# Patient Record
Sex: Male | Born: 1975 | Race: White | Hispanic: No | Marital: Married | Smoking: Current every day smoker
Health system: Southern US, Community
[De-identification: ages and names within clinical notes are randomized; demographics above are authoritative.]

---

## 2003-08-27 ENCOUNTER — Emergency Department (HOSPITAL_COMMUNITY): Admission: EM | Admit: 2003-08-27 | Discharge: 2003-08-27 | Payer: Self-pay | Admitting: Emergency Medicine

## 2004-10-10 ENCOUNTER — Emergency Department (HOSPITAL_COMMUNITY): Admission: EM | Admit: 2004-10-10 | Discharge: 2004-10-11 | Payer: Self-pay | Admitting: Emergency Medicine

## 2005-12-02 ENCOUNTER — Emergency Department (HOSPITAL_COMMUNITY): Admission: EM | Admit: 2005-12-02 | Discharge: 2005-12-02 | Payer: Self-pay | Admitting: Emergency Medicine

## 2006-01-17 ENCOUNTER — Emergency Department (HOSPITAL_COMMUNITY): Admission: EM | Admit: 2006-01-17 | Discharge: 2006-01-17 | Payer: Self-pay | Admitting: Emergency Medicine

## 2010-12-14 ENCOUNTER — Encounter (HOSPITAL_COMMUNITY): Payer: Self-pay

## 2010-12-14 MED ORDER — HEPARIN SOD (PORK) LOCK FLUSH 100 UNIT/ML IV SOLN
500.0000 [IU] | Freq: Once | INTRAVENOUS | Status: AC
  Filled 2010-12-14: qty 5

## 2010-12-14 MED ORDER — SODIUM CHLORIDE 0.9 % IJ SOLN
10.0000 mL | Freq: Once | INTRAMUSCULAR | Status: AC
  Filled 2010-12-14: qty 10

## 2010-12-14 MED ORDER — HEPARIN SOD (PORK) LOCK FLUSH 100 UNIT/ML IV SOLN
INTRAVENOUS | Status: AC
  Filled 2010-12-14: qty 5

## 2010-12-14 MED ORDER — SODIUM CHLORIDE 0.9 % IJ SOLN
INTRAMUSCULAR | Status: AC
  Filled 2010-12-14: qty 20

## 2011-01-07 ENCOUNTER — Ambulatory Visit (HOSPITAL_COMMUNITY): Payer: Self-pay | Admitting: Oncology

## 2011-01-14 MED ORDER — EPOETIN ALFA 40000 UNIT/ML IJ SOLN
INTRAMUSCULAR | Status: AC
  Filled 2011-01-14: qty 1

## 2011-04-24 ENCOUNTER — Other Ambulatory Visit (HOSPITAL_COMMUNITY): Payer: Self-pay | Admitting: Oncology

## 2011-10-14 ENCOUNTER — Encounter (HOSPITAL_COMMUNITY): Payer: Self-pay | Admitting: Oncology

## 2015-12-05 ENCOUNTER — Encounter (HOSPITAL_COMMUNITY)
Admission: EM | Disposition: A | Discharge status: Home / Self Care | Payer: Self-pay | Source: Home / Self Care | Attending: Emergency Medicine

## 2015-12-05 ENCOUNTER — Emergency Department (HOSPITAL_COMMUNITY): Payer: Self-pay

## 2015-12-05 ENCOUNTER — Emergency Department (HOSPITAL_COMMUNITY): Payer: Self-pay | Admitting: Anesthesiology

## 2015-12-05 ENCOUNTER — Ambulatory Visit (HOSPITAL_COMMUNITY)
Admission: EM | Admit: 2015-12-05 | Discharge: 2015-12-05 | Disposition: A | Discharge status: Home / Self Care | Payer: Self-pay | Attending: Emergency Medicine | Admitting: Emergency Medicine

## 2015-12-05 ENCOUNTER — Encounter (HOSPITAL_COMMUNITY): Payer: Self-pay | Admitting: Emergency Medicine

## 2015-12-05 DIAGNOSIS — S60552A Superficial foreign body of left hand, initial encounter: Secondary | ICD-10-CM

## 2015-12-05 DIAGNOSIS — Z1810 Retained metal fragments, unspecified: Secondary | ICD-10-CM | POA: Insufficient documentation

## 2015-12-05 DIAGNOSIS — F17210 Nicotine dependence, cigarettes, uncomplicated: Secondary | ICD-10-CM | POA: Insufficient documentation

## 2015-12-05 DIAGNOSIS — Y93H3 Activity, building and construction: Secondary | ICD-10-CM | POA: Insufficient documentation

## 2015-12-05 DIAGNOSIS — Y998 Other external cause status: Secondary | ICD-10-CM | POA: Insufficient documentation

## 2015-12-05 DIAGNOSIS — W294XXA Contact with nail gun, initial encounter: Secondary | ICD-10-CM | POA: Insufficient documentation

## 2015-12-05 DIAGNOSIS — S62621B Displaced fracture of medial phalanx of left index finger, initial encounter for open fracture: Secondary | ICD-10-CM | POA: Insufficient documentation

## 2015-12-05 DIAGNOSIS — S60458A Superficial foreign body of other finger, initial encounter: Secondary | ICD-10-CM

## 2015-12-05 HISTORY — PX: FOREIGN BODY REMOVAL: SHX962

## 2015-12-05 HISTORY — PX: I & D EXTREMITY: SHX5045

## 2015-12-05 LAB — CBC WITH DIFFERENTIAL/PLATELET
Basophils Absolute: 0.1 10*3/uL (ref 0.0–0.1)
Basophils Relative: 1 %
Eosinophils Absolute: 0.4 10*3/uL (ref 0.0–0.7)
Eosinophils Relative: 4 %
HEMATOCRIT: 48.4 % (ref 39.0–52.0)
HEMOGLOBIN: 16.1 g/dL (ref 13.0–17.0)
LYMPHS ABS: 1.6 10*3/uL (ref 0.7–4.0)
LYMPHS PCT: 16 %
MCH: 30.8 pg (ref 26.0–34.0)
MCHC: 33.3 g/dL (ref 30.0–36.0)
MCV: 92.5 fL (ref 78.0–100.0)
MONOS PCT: 12 %
Monocytes Absolute: 1.2 10*3/uL — ABNORMAL HIGH (ref 0.1–1.0)
NEUTROS ABS: 6.8 10*3/uL (ref 1.7–7.7)
NEUTROS PCT: 67 %
Platelets: 224 10*3/uL (ref 150–400)
RBC: 5.23 MIL/uL (ref 4.22–5.81)
RDW: 12.9 % (ref 11.5–15.5)
WBC: 10 10*3/uL (ref 4.0–10.5)

## 2015-12-05 LAB — COMPREHENSIVE METABOLIC PANEL
ALK PHOS: 79 U/L (ref 38–126)
ALT: 23 U/L (ref 17–63)
AST: 23 U/L (ref 15–41)
Albumin: 4.3 g/dL (ref 3.5–5.0)
Anion gap: 6 (ref 5–15)
BUN: 10 mg/dL (ref 6–20)
CALCIUM: 9 mg/dL (ref 8.9–10.3)
CO2: 27 mmol/L (ref 22–32)
CREATININE: 1.26 mg/dL — AB (ref 0.61–1.24)
Chloride: 105 mmol/L (ref 101–111)
Glucose, Bld: 105 mg/dL — ABNORMAL HIGH (ref 65–99)
Potassium: 3.3 mmol/L — ABNORMAL LOW (ref 3.5–5.1)
Sodium: 138 mmol/L (ref 135–145)
Total Bilirubin: 0.7 mg/dL (ref 0.3–1.2)
Total Protein: 6.3 g/dL — ABNORMAL LOW (ref 6.5–8.1)

## 2015-12-05 SURGERY — IRRIGATION AND DEBRIDEMENT EXTREMITY
Anesthesia: General | Site: Finger | Laterality: Left

## 2015-12-05 MED ORDER — VANCOMYCIN HCL IN DEXTROSE 1-5 GM/200ML-% IV SOLN
1000.0000 mg | Freq: Once | INTRAVENOUS | Status: AC
  Administered 2015-12-05: 1000 mg via INTRAVENOUS
  Filled 2015-12-05: qty 200

## 2015-12-05 MED ORDER — MORPHINE SULFATE (PF) 2 MG/ML IV SOLN: 2.0000 mg | INTRAVENOUS | Status: DC | PRN

## 2015-12-05 MED ORDER — MIDAZOLAM HCL 2 MG/2ML IJ SOLN
INTRAMUSCULAR | Status: AC
  Filled 2015-12-05: qty 2

## 2015-12-05 MED ORDER — FENTANYL CITRATE (PF) 100 MCG/2ML IJ SOLN
INTRAMUSCULAR | Status: AC
  Filled 2015-12-05: qty 2

## 2015-12-05 MED ORDER — LIDOCAINE HCL (CARDIAC) 20 MG/ML IV SOLN
INTRAVENOUS | Status: DC | PRN
  Administered 2015-12-05: 60 mg via INTRAVENOUS

## 2015-12-05 MED ORDER — PROPOFOL 10 MG/ML IV BOLUS
INTRAVENOUS | Status: AC
  Filled 2015-12-05: qty 20

## 2015-12-05 MED ORDER — BUPIVACAINE HCL (PF) 0.25 % IJ SOLN
INTRAMUSCULAR | Status: DC | PRN
  Administered 2015-12-05: 7 mL

## 2015-12-05 MED ORDER — SODIUM CHLORIDE 0.9 % IR SOLN
Status: DC | PRN
  Administered 2015-12-05: 3000 mL

## 2015-12-05 MED ORDER — FENTANYL CITRATE (PF) 100 MCG/2ML IJ SOLN
INTRAMUSCULAR | Status: DC | PRN
  Administered 2015-12-05 (×4): 50 ug via INTRAVENOUS

## 2015-12-05 MED ORDER — ONDANSETRON HCL 4 MG/2ML IJ SOLN
INTRAMUSCULAR | Status: DC | PRN
  Administered 2015-12-05: 4 mg via INTRAVENOUS

## 2015-12-05 MED ORDER — EPHEDRINE 5 MG/ML INJ
INTRAVENOUS | Status: AC
  Filled 2015-12-05: qty 10

## 2015-12-05 MED ORDER — MIDAZOLAM HCL 5 MG/5ML IJ SOLN
INTRAMUSCULAR | Status: DC | PRN
  Administered 2015-12-05: 2 mg via INTRAVENOUS

## 2015-12-05 MED ORDER — OXYCODONE-ACETAMINOPHEN 5-325 MG PO TABS
2.0000 | ORAL_TABLET | ORAL | 0 refills | Status: AC | PRN
Start: 2015-12-05 — End: ?

## 2015-12-05 MED ORDER — LACTATED RINGERS IV SOLN
INTRAVENOUS | Status: DC
  Administered 2015-12-05 (×3): via INTRAVENOUS

## 2015-12-05 MED ORDER — CIPROFLOXACIN HCL 500 MG PO TABS: 500.0000 mg | ORAL_TABLET | Freq: Two times a day (BID) | ORAL | 0 refills | Status: AC

## 2015-12-05 MED ORDER — PROPOFOL 10 MG/ML IV BOLUS
INTRAVENOUS | Status: DC | PRN
  Administered 2015-12-05: 200 mg via INTRAVENOUS

## 2015-12-05 MED ORDER — HYDROMORPHONE HCL 1 MG/ML IJ SOLN: 0.2500 mg | INTRAMUSCULAR | Status: DC | PRN

## 2015-12-05 MED ORDER — METHOCARBAMOL 1000 MG/10ML IJ SOLN
500.0000 mg | Freq: Once | INTRAMUSCULAR | Status: DC
  Filled 2015-12-05: qty 5

## 2015-12-05 MED ORDER — BUPIVACAINE HCL (PF) 0.25 % IJ SOLN
INTRAMUSCULAR | Status: AC
  Filled 2015-12-05: qty 30

## 2015-12-05 MED ORDER — PROMETHAZINE HCL 25 MG/ML IJ SOLN: 6.2500 mg | INTRAMUSCULAR | Status: DC | PRN

## 2015-12-05 MED ORDER — FENTANYL CITRATE (PF) 100 MCG/2ML IJ SOLN
INTRAMUSCULAR | Status: AC
  Filled 2015-12-05: qty 4

## 2015-12-05 MED ORDER — GLYCOPYRROLATE 0.2 MG/ML IJ SOLN
INTRAMUSCULAR | Status: DC | PRN
  Administered 2015-12-05: 0.2 mg via INTRAVENOUS

## 2015-12-05 MED ORDER — FENTANYL CITRATE (PF) 100 MCG/2ML IJ SOLN
INTRAMUSCULAR | Status: AC
  Administered 2015-12-05: 100 ug via INTRAVENOUS
  Filled 2015-12-05: qty 2

## 2015-12-05 MED ORDER — PHENYLEPHRINE 40 MCG/ML (10ML) SYRINGE FOR IV PUSH (FOR BLOOD PRESSURE SUPPORT)
PREFILLED_SYRINGE | INTRAVENOUS | Status: AC
  Filled 2015-12-05: qty 10

## 2015-12-05 MED ORDER — FENTANYL CITRATE (PF) 100 MCG/2ML IJ SOLN
100.0000 ug | Freq: Once | INTRAMUSCULAR | Status: AC
  Administered 2015-12-05: 100 ug via INTRAVENOUS
  Filled 2015-12-05: qty 2

## 2015-12-05 MED ORDER — LIDOCAINE 2% (20 MG/ML) 5 ML SYRINGE
INTRAMUSCULAR | Status: AC
  Filled 2015-12-05: qty 10

## 2015-12-05 MED ORDER — LORAZEPAM 2 MG/ML IJ SOLN: 0.5000 mg | INTRAMUSCULAR | Status: DC | PRN

## 2015-12-05 MED ORDER — MEPERIDINE HCL 25 MG/ML IJ SOLN: 6.2500 mg | INTRAMUSCULAR | Status: DC | PRN

## 2015-12-05 MED ORDER — DEXAMETHASONE SODIUM PHOSPHATE 10 MG/ML IJ SOLN
INTRAMUSCULAR | Status: AC
  Filled 2015-12-05: qty 1

## 2015-12-05 SURGICAL SUPPLY — 37 items
BANDAGE COBAN STERILE 2 (GAUZE/BANDAGES/DRESSINGS) ×2 IMPLANT
BNDG CONFORM 2 STRL LF (GAUZE/BANDAGES/DRESSINGS) ×4 IMPLANT
CORDS BIPOLAR (ELECTRODE) ×3 IMPLANT
CUFF TOURNIQUET SINGLE 18IN (TOURNIQUET CUFF) ×3 IMPLANT
CUFF TOURNIQUET SINGLE 24IN (TOURNIQUET CUFF) IMPLANT
DRAPE OEC MINIVIEW 54X84 (DRAPES) ×2 IMPLANT
DRSG ADAPTIC 3X8 NADH LF (GAUZE/BANDAGES/DRESSINGS) ×3 IMPLANT
DRSG EMULSION OIL 3X3 NADH (GAUZE/BANDAGES/DRESSINGS) ×2 IMPLANT
GAUZE SPONGE 4X4 12PLY STRL (GAUZE/BANDAGES/DRESSINGS) ×2 IMPLANT
GAUZE XEROFORM 1X8 LF (GAUZE/BANDAGES/DRESSINGS) ×3 IMPLANT
GLOVE BIOGEL M 8.0 STRL (GLOVE) ×3 IMPLANT
GLOVE SS BIOGEL STRL SZ 8 (GLOVE) ×1 IMPLANT
GLOVE SUPERSENSE BIOGEL SZ 8 (GLOVE) ×2
GOWN STRL REUS W/ TWL LRG LVL3 (GOWN DISPOSABLE) ×1 IMPLANT
GOWN STRL REUS W/ TWL XL LVL3 (GOWN DISPOSABLE) ×2 IMPLANT
GOWN STRL REUS W/TWL LRG LVL3 (GOWN DISPOSABLE) ×3
GOWN STRL REUS W/TWL XL LVL3 (GOWN DISPOSABLE) ×6
KIT BASIN OR (CUSTOM PROCEDURE TRAY) ×3 IMPLANT
KIT ROOM TURNOVER OR (KITS) ×3 IMPLANT
MANIFOLD NEPTUNE II (INSTRUMENTS) ×5 IMPLANT
NDL HYPO 25GX1X1/2 BEV (NEEDLE) IMPLANT
NEEDLE HYPO 25GX1X1/2 BEV (NEEDLE) ×3 IMPLANT
NS IRRIG 1000ML POUR BTL (IV SOLUTION) ×3 IMPLANT
PACK ORTHO EXTREMITY (CUSTOM PROCEDURE TRAY) ×3 IMPLANT
PAD ARMBOARD 7.5X6 YLW CONV (MISCELLANEOUS) ×3 IMPLANT
PAD CAST 4YDX4 CTTN HI CHSV (CAST SUPPLIES) ×1 IMPLANT
PADDING CAST COTTON 4X4 STRL (CAST SUPPLIES) ×3
SPLINT FINGER W/BULB (SOFTGOODS) ×2 IMPLANT
SPONGE LAP 4X18 X RAY DECT (DISPOSABLE) ×3 IMPLANT
SUT CHROMIC 5 0 P 3 (SUTURE) ×4 IMPLANT
SUT PROLENE 4 0 P 3 18 (SUTURE) ×2 IMPLANT
SYR CONTROL 10ML LL (SYRINGE) ×2 IMPLANT
TOWEL OR 17X24 6PK STRL BLUE (TOWEL DISPOSABLE) ×3 IMPLANT
TOWEL OR 17X26 10 PK STRL BLUE (TOWEL DISPOSABLE) ×3 IMPLANT
TUBE CONNECTING 12'X1/4 (SUCTIONS) ×1
TUBE CONNECTING 12X1/4 (SUCTIONS) ×2 IMPLANT
YANKAUER SUCT BULB TIP NO VENT (SUCTIONS) ×3 IMPLANT

## 2015-12-05 NOTE — Anesthesia Postprocedure Evaluation (Signed)
Anesthesia Post Note  Patient: Russell MallingDavid R Brobeck  Procedure(s) Performed: Procedure(s) (LRB): IRRIGATION AND DEBRIDEMENT EXTREMITY (Left) REMOVAL FOREIGN BODY EXTREMITY (Left)  Patient location during evaluation: PACU Anesthesia Type: General Level of consciousness: awake, awake and alert and oriented Pain management: pain level controlled Vital Signs Assessment: post-procedure vital signs reviewed and stable Respiratory status: spontaneous breathing, nonlabored ventilation and respiratory function stable Cardiovascular status: blood pressure returned to baseline Anesthetic complications: no    Last Vitals:  Vitals:   12/05/15 1919 12/05/15 1933  BP: 138/81 (!) 141/88  Pulse: 60 65  Resp: 12 12  Temp: 36.6 C     Last Pain:  Vitals:   12/05/15 1919  TempSrc:   PainSc: Asleep                 Elroy Schembri,Jamie COKER

## 2015-12-05 NOTE — ED Notes (Signed)
orto into see pt and permit signed

## 2015-12-05 NOTE — ED Notes (Signed)
Pt states he was using nail gun to put up fence and  His hand struck the trigger and the nail went thru his finger, pt states UTD on tetanus states was 2 years ago

## 2015-12-05 NOTE — Anesthesia Preprocedure Evaluation (Signed)
Anesthesia Evaluation  Patient identified by MRN, date of birth, ID band Patient awake    Reviewed: Allergy & Precautions, NPO status , Patient's Chart, lab work & pertinent test results  Airway Mallampati: II  TM Distance: >3 FB Neck ROM: Full    Dental no notable dental hx.    Pulmonary neg pulmonary ROS, Current Smoker,    Pulmonary exam normal breath sounds clear to auscultation       Cardiovascular negative cardio ROS Normal cardiovascular exam Rhythm:Regular Rate:Normal     Neuro/Psych negative neurological ROS  negative psych ROS   GI/Hepatic negative GI ROS, Neg liver ROS,   Endo/Other  negative endocrine ROS  Renal/GU negative Renal ROS     Musculoskeletal negative musculoskeletal ROS (+)   Abdominal   Peds  Hematology negative hematology ROS (+)   Anesthesia Other Findings   Reproductive/Obstetrics negative OB ROS                             Anesthesia Physical Anesthesia Plan  ASA: II  Anesthesia Plan: General   Post-op Pain Management:    Induction: Intravenous  Airway Management Planned: Oral ETT and LMA  Additional Equipment:   Intra-op Plan:   Post-operative Plan: Extubation in OR  Informed Consent: I have reviewed the patients History and Physical, chart, labs and discussed the procedure including the risks, benefits and alternatives for the proposed anesthesia with the patient or authorized representative who has indicated his/her understanding and acceptance.   Dental advisory given  Plan Discussed with: CRNA  Anesthesia Plan Comments:         Anesthesia Quick Evaluation

## 2015-12-05 NOTE — ED Notes (Signed)
Pt returned from xray

## 2015-12-05 NOTE — Op Note (Signed)
See dictation#[id number]  Status post foreign body removal left index finger (nail from a nail gun)  Flexor tenolysis/tenosynovectomy of the FDP and FDS tendons left index finger Extensor tendon tenolysis/tenosynovectomy left index finger Arthrotomy synovectomy PIP joint with treatment of open fracture about the middle phalanx Xray 4 view hand  Begin early range of motion. Plan for discharge with by mouth antibiotics  Discharge medicines Cipro 500 mg 1 by mouth twice a day OxyIR 5 mg 1-2 every 4-6 hours when necessary pain by mouth  Sorah Falkenstein M.D.

## 2015-12-05 NOTE — ED Triage Notes (Signed)
Pt with nail in 2nd digit in left hand

## 2015-12-05 NOTE — H&P (Signed)
Russell MallingDavid R Dennis is an 40 y.o. male.   Chief Complaint: Noted an injury left index finger HPI: The patient is a pleasant 40 year old gentleman who [facility] for evaluation of his left hand in particular his left index finger. He was working on a fence earlier today when he states while holding a board he accidentally hit the nail gun, nail gun fired impaling his left index finger at the level of the PIP joint. He was seen and evaluated by the emergency room staff his finger is neurovascularly intact however radiographs reveal that the PIP joint has been violated with the nail. Patient currently is comfortable and has no complaints other than he states "he wants to smoke". He denies any other injury to the hand or upper extremity acutely.  History reviewed. No pertinent past medical history.  History reviewed. No pertinent surgical history.  History reviewed. No pertinent family history. Social History:  reports that he has been smoking.  He has never used smokeless tobacco. He reports that he drinks alcohol. He reports that he does not use drugs. The patient smokes approximately 1-2 packs a day and states he consumes alcohol on a daily basis upwards of 12 beers.  Allergies:  Allergies  Allergen Reactions  . Penicillins      (Not in a hospital admission)  No results found for this or any previous visit (from the past 48 hour(s)). Dg Hand Complete Left  Result Date: 12/05/2015 CLINICAL DATA:  Nail gun discharge with nail and index finger. EXAM: LEFT HAND - COMPLETE 3+ VIEW COMPARISON:  None. FINDINGS: Nail passes through the index finger in the region of the proximal aspect of the middle phalanx in the PIP joint. Nail enters from the palm are lateral side. Nail height look is just to the level of the scan. Nail tip protrudes from the dorsal medial portion of the finger. One can appreciate some degree of fracture of the proximal aspect of the middle phalanx.  IMPRESSION: Nail passes through the base of the proximal phalanx and the PIP joint. Electronically Signed   By: Paulina FusiMark  Shogry M.D.   On: 12/05/2015 12:48    Review of Systems  Constitutional: Negative.   Eyes: Negative.   Respiratory: Negative.   Cardiovascular: Negative.   Gastrointestinal: Negative.   Musculoskeletal:       See history of present illness  Skin: Negative.     Blood pressure 132/77, pulse 70, temperature 98 F (36.7 C), temperature source Oral, resp. rate 18, height 5\' 11"  (1.803 m), weight 91.2 kg (201 lb), SpO2 98 %. Physical Exam  The patient is alert and oriented in no acute distress. The patient complains of pain in the affected upper extremity.  The patient is noted to have a normal HEENT exam. Lung fields show equal chest expansion and no shortness of breath. Abdomen exam is nontender without distention. Lower extremity examination does not show any fracture dislocation or blood clot symptoms. Pelvis is stable and the neck and back are stable and nontender. Examination of the left hand shows he has an obvious foreign body about the left index finger, he has a barbed nail entering in the volar PIP region of the finger and exiting dorsally. His refill is intact, his sensation is intact, range of motion is impeded given the foreign body. He has multiple abrasions about the fingers and hand which are superficial and non-complicated Assessment/Plan Left index finger nail gun injury with violation of the PIP  joint and retained foreign body Middle and proximal phalanx fractures left index finger History of chronic alcohol use History of tobacco abuse We have discussed with the patient had recommendations to proceed to the operative suite for formal irrigation and debridement as well as removal of the foreign body given the involvement of the PIP joint family irrigation in the operative suite is recommended. We have discussed with the patient withdrawal precautions given  his daily consumption of alcohol, upwards of 12 beers daily. He denies having any withdrawal symptoms at the time and denies any previous history of withdrawal.We are planning surgery for your upper extremity. The risk and benefits of surgery to include risk of bleeding, infection, anesthesia,  damage to normal structures and failure of the surgery to accomplish its intended goals of relieving symptoms and restoring function have been discussed in detail. With this in mind we plan to proceed. I have specifically discussed with the patient the pre-and postoperative regime and the dos and don'ts and risk and benefits in great detail. Risk and benefits of surgery also include risk of dystrophy(CRPS), chronic nerve pain, failure of the healing process to go onto completion and other inherent risks of surgery The relavent the pathophysiology of the disease/injury process, as well as the alternatives for treatment and postoperative course of action has been discussed in great detail with the patient who desires to proceed.  We will do everything in our power to help you (the patient) restore function to the upper extremity. It is a pleasure to see this patient today.   Milus Fritze L, PA-C 12/05/2015, 2:17 PM

## 2015-12-05 NOTE — Transfer of Care (Signed)
Immediate Anesthesia Transfer of Care Note  Patient: Russell Dennis  Procedure(s) Performed: Procedure(s): IRRIGATION AND DEBRIDEMENT EXTREMITY (Left) REMOVAL FOREIGN BODY EXTREMITY (Left)  Patient Location: PACU  Anesthesia Type:General  Level of Consciousness: awake, alert  and oriented  Airway & Oxygen Therapy: Patient Spontanous Breathing  Post-op Assessment: Report given to RN and Post -op Vital signs reviewed and stable  Post vital signs: Reviewed and stable  Last Vitals:  Vitals:   12/05/15 1126 12/05/15 1919  BP: 132/77 138/81  Pulse: 70 60  Resp: 18 12  Temp: 36.7 C 36.6 C    Last Pain:  Vitals:   12/05/15 1919  TempSrc:   PainSc: Asleep         Complications: No apparent anesthesia complications

## 2015-12-05 NOTE — Discharge Instructions (Signed)

## 2015-12-05 NOTE — ED Provider Notes (Signed)
[facility]EMER[facility]ENCY DEPT Provider Note   CSN: [id number] Arrival date & time: 12/05/15  1123     History   Chief Complaint Chief Complaint  Patient presents with  . Finger Injury    HPI Russell Dennis is a 40 y.o. male.  HPI   Patient is a 40 year old male who presents to the emergency department after a nail gun shot a nail into his left pointer finger roughly 20 minutes PTA. Patient states this was accidental. He denies any pain. He has mild burning sensation around the area of the entry of the nail. Patient is unable to bend his PIP or DIP. Patient denies numbness/tingling. Patient denies other injuries. Patient states last tetanus was roughly 2 years ago.   History reviewed. No pertinent past medical history.  There are no active problems to display for this patient.   History reviewed. No pertinent surgical history.     Home Medications    Prior to Admission medications   Not on File    Family History History reviewed. No pertinent family history.  Social History Social History  Substance Use Topics  . Smoking status: Current Every Day Smoker  . Smokeless tobacco: Never Used  . Alcohol use Yes     Allergies   Penicillins   Review of Systems Review of Systems  Constitutional: Negative for fever.  Gastrointestinal: Negative for nausea and vomiting.  Skin: Positive for wound.  Neurological: Negative for weakness and numbness.  Psychiatric/Behavioral: Negative for self-injury.     Physical Exam Updated Vital Signs BP 132/77 (BP Location: Right Arm)   Pulse 70   Temp 98 F (36.7 C) (Oral)   Resp 18   Ht 5\' 11"  (1.803 m)   Wt 91.2 kg   SpO2 98%   BMI 28.03 kg/m   Physical Exam  Constitutional: He appears well-developed and well-nourished. No distress.  HENT:  Head: Normocephalic and atraumatic.  Eyes: Conjunctivae are normal.  Pulmonary/Chest: Effort normal. No respiratory distress.  Musculoskeletal: Normal range of motion.  Examination of  left hand revealed nail through and through the first digit between the DIP and PIP joint, entry wound on the palmar surface, sensation intact, brisk capillary refill, full range of motion of MCP joint, no range of motion of PIP joint with limited range of motion of DIP joint, patient neurovascularly intact distally.  Neurological: He is alert. Coordination normal.  Skin: Skin is warm and dry. He is not diaphoretic.  Psychiatric: He has a normal mood and affect. His behavior is normal.  Nursing note and vitals reviewed.    ED Treatments / Results  Labs (all labs ordered are listed, but only abnormal results are displayed) Labs Reviewed - No data to display  EKG  EKG Interpretation None       Radiology No results found.  Procedures Procedures (including critical care time)  Medications Ordered in ED Medications - No data to display   Initial Impression / Assessment and Plan / ED Course  I have reviewed the triage vital signs and the nursing notes.  Pertinent labs & imaging results that were available during my care of the patient were reviewed by me and considered in my medical decision making (see chart for details).  Clinical Course   Pt with nail through the PIP joint of the left pointer finger. Consulted hand surgery. Karie ChimeraBrian Buchanan, PA-C came to the ED to see the pt, Dr. Amanda PeaGramig is going to take the pt to the OR today. Pre-op labs, EKG and  chest xray have been ordered and are pending. Pt will be admitted.  Thank you Mr. Wynona NeatBuchanan and Dr. Amanda PeaGramig for your time, consult and care of this patient.   Pt case discussed and pt seen by Dr. Clayborne DanaMesner who agrees with the above plan.     Final Clinical Impressions(s) / ED Diagnoses   Final diagnoses:  Penetrating foreign body of skin of index finger, initial encounter    New Prescriptions New Prescriptions   No medications on file     Jerre SimonJessica L Quindell Shere, GeorgiaPA 12/05/15 1415    Marily MemosJason Mesner, MD 12/05/15 1606

## 2015-12-05 NOTE — Anesthesia Procedure Notes (Signed)
Procedure Name: Intubation Date/Time: 12/05/2015 5:52 PM Performed by: Russell Dennis, Russell Dennis Pre-anesthesia Checklist: Patient identified, Emergency Drugs available, Suction available, Patient being monitored and Timeout performed Patient Re-evaluated:Patient Re-evaluated prior to inductionOxygen Delivery Method: Circle system utilized and Simple face mask Preoxygenation: Pre-oxygenation with 100% oxygen Intubation Type: IV induction Ventilation: Mask ventilation without difficulty LMA: LMA inserted LMA Size: 4.5 Number of attempts: 1 Airway Equipment and Method: Patient positioned with wedge pillow Placement Confirmation: positive ETCO2 and breath sounds checked- equal and bilateral Tube secured with: Tape Dental Injury: Teeth and Oropharynx as per pre-operative assessment

## 2015-12-05 NOTE — ED Provider Notes (Signed)
Medical screening examination/treatment/procedure(s) were conducted as a shared visit with non-physician practitioner(s) and myself.  I personally evaluated the patient during the encounter.  Nail through his finger. XR with evidence of joint involvement. Plan for ancef/hand consult. Tetanus already updated. Pain controlled.    Marily MemosJason Takhia Spoon, MD 12/05/15 [phone]

## 2015-12-06 ENCOUNTER — Encounter (HOSPITAL_COMMUNITY): Payer: Self-pay | Admitting: Orthopedic Surgery

## 2015-12-06 NOTE — Op Note (Signed)
NAMCandace Cruise:  Alligood, Antwann                   ACCOUNT NO.:  [id number]  MEDICAL RECORD NO.:  [id number]  LOCATION:  [facility]                         FACILITY:  [facility]  PHYSICIAN:  Dionne AnoWilliam M. Amatullah Christy, M.D.DATE OF BIRTH:  1975/06/28  DATE OF PROCEDURE:  12/05/2015 DATE OF DISCHARGE:  12/05/2015                              OPERATIVE REPORT   PREOPERATIVE DIAGNOSIS:  Nail gun injury to left index finger with open fracture, open PIP joint and significant soft tissue disarray.  POSTOPERATIVE DIAGNOSIS:  Nail gun injury to left index finger with open fracture, open PIP joint and significant soft tissue disarray.  PROCEDURE: 1. Irrigation and debridement of skin, subcutaneous tissue, bone,     tendon, and associated soft tissue of left index finger.  This was     an excisional debridement with curette, knife, blade, and scissor. 2. Removal of foreign body (nail with 2 separate barbs), left index     finger. 3. Flexor tenolysis, tenosynovectomy, flexor digitorum profundus and     flexor digitorum superficialis tendon of left index finger. 4. Extensor tendon tenolysis, tenosynovectomy of left index finger. 5. Arthrotomy and synovectomy of PIP joint with treatment of open     fracture about the middle phalanx. 6. Four view x-ray series of left hand.  SURGEON:  Dionne AnoWilliam M. Amanda PeaGramig, M.D.  ASSISTANT:  Karie ChimeraBrian Buchanan, P.A.-C.  COMPLICATION:  None.  ANESTHESIA:  General.  TOURNIQUET TIME:  Less than an hour.  INDICATIONS FOR THE PROCEDURE:  A 40 year old male status post severe injury with a nail gun with open fracture and an impaled embedded nail.  The patient understands risks and benefits and desires to proceed.  OPERATIVE PROCEDURE:  The patient was seen by myself and Anesthesia, underwent general anesthetic, prepped and draped in usual sterile fashion with 2 separate Betadine scrub and paint.  Time-out was observed.  Preoperative antibiotics given.  Following this, the patient had an incision  made dorsally, dorsal incision was made.  The patient underwent extensor tenolysis, tenosynovectomy as it was bound up in the nail and the barbs.  I very carefully and cautiously performed extensor tenolysis, tenosynovectomy, and removed the entangled tissue from the barbs.  Following this, I turned the hand over and performed a Chevron incision, dissection was carried down.  Neurovascular bundles carefully protected, and the FDP and FDS tendons were wound up in the barbs and underwent a tenolysis, tenosynovectomy in freeing technique.  Following this, I then clipped areas of the nail both dorsally and volarly and then ultimately removed the nail which was embedded through the middle phalanx and into the PIP joint.  At this time, the tenolysis, tenosynovectomy of the extensor and flexor apparatus was completed.  The foreign body was removed, and we performed arthrotomy and synovectomy of the PIP joint with open treatment of middle phalanx fracture.  3 L of saline were placed through and through. I utilized a series of Angiocatheters and other measures to make sure we had a wonderful bony debridement.  Curette was used and 4D x-ray series of the hand showed adequate position of the fracture.  We performed open treatment of middle phalanx fracture and arthrotomy and synovectomy of the  PIP joint.  There were no complicating features.  Four view x-ray series looked excellent, range of motion was stable, collateral ligament architecture was intact.  Tourniquet was deflated during the irrigation process.  Wounds were closed loosely.  There were no complicating features.  Sterile dressing was applied.  Patient was taken to recovery room.  We will discharge him on Cipro 500 mg 1 p.o. b.i.d. x14 days.  OxyIR p.r.n. pain.  We will see him back in the office.  Should any problems occur, immediately. Otherwise, we will see him back in 3-5 days and begin aggressive motion.     Dionne AnoWilliam M.  Amanda PeaGramig, M.D.     [facility]/MEDQ  D:  12/05/2015  T:  12/06/2015  Job:  [id number]

## 2017-09-12 IMAGING — CR DG CHEST 2V
2 series · 2 of 2 positions shown · non-contrast
Comparison: None.

CLINICAL DATA: Preop for finger surgery

EXAM:
CHEST  2 VIEW

[chest pa]
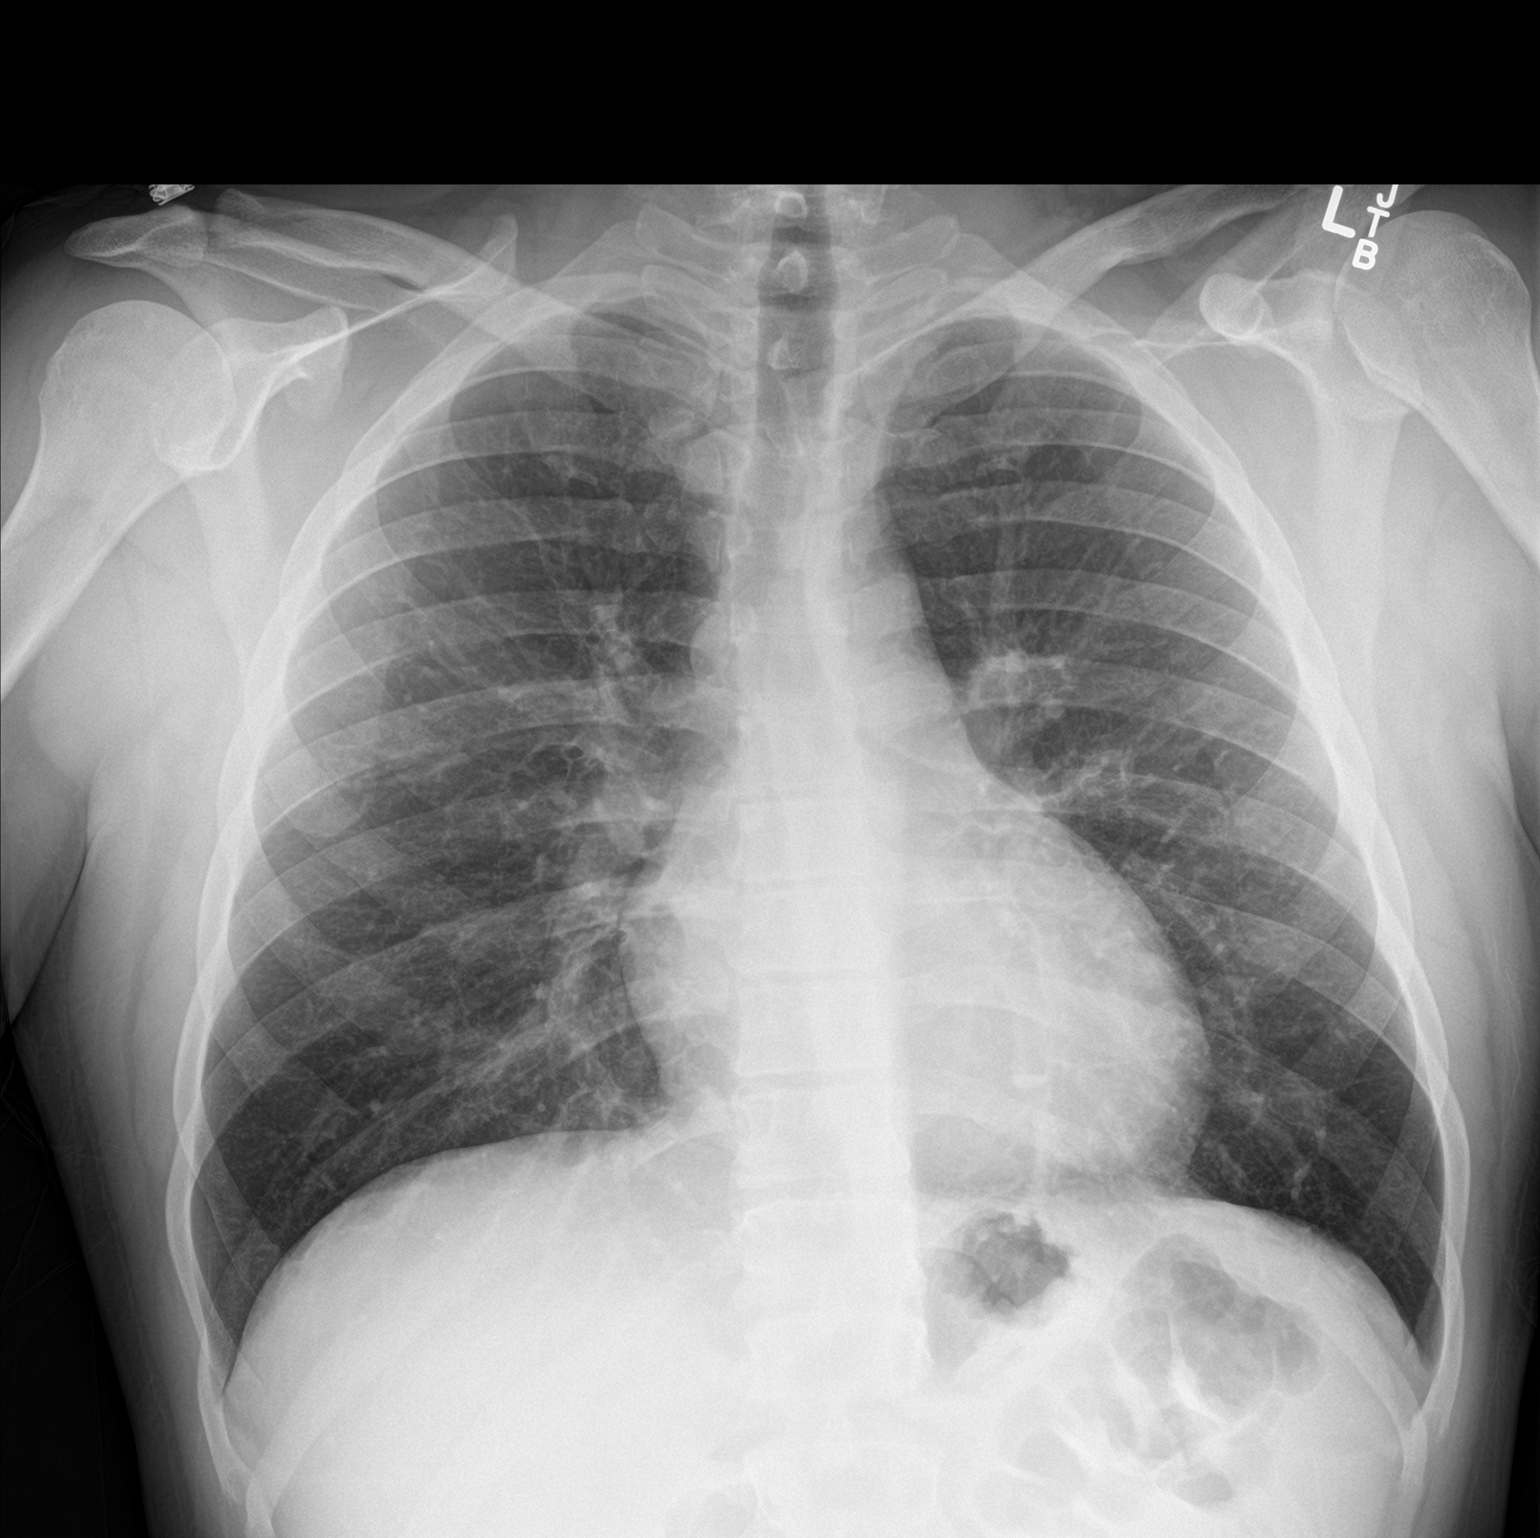

[chest lat]
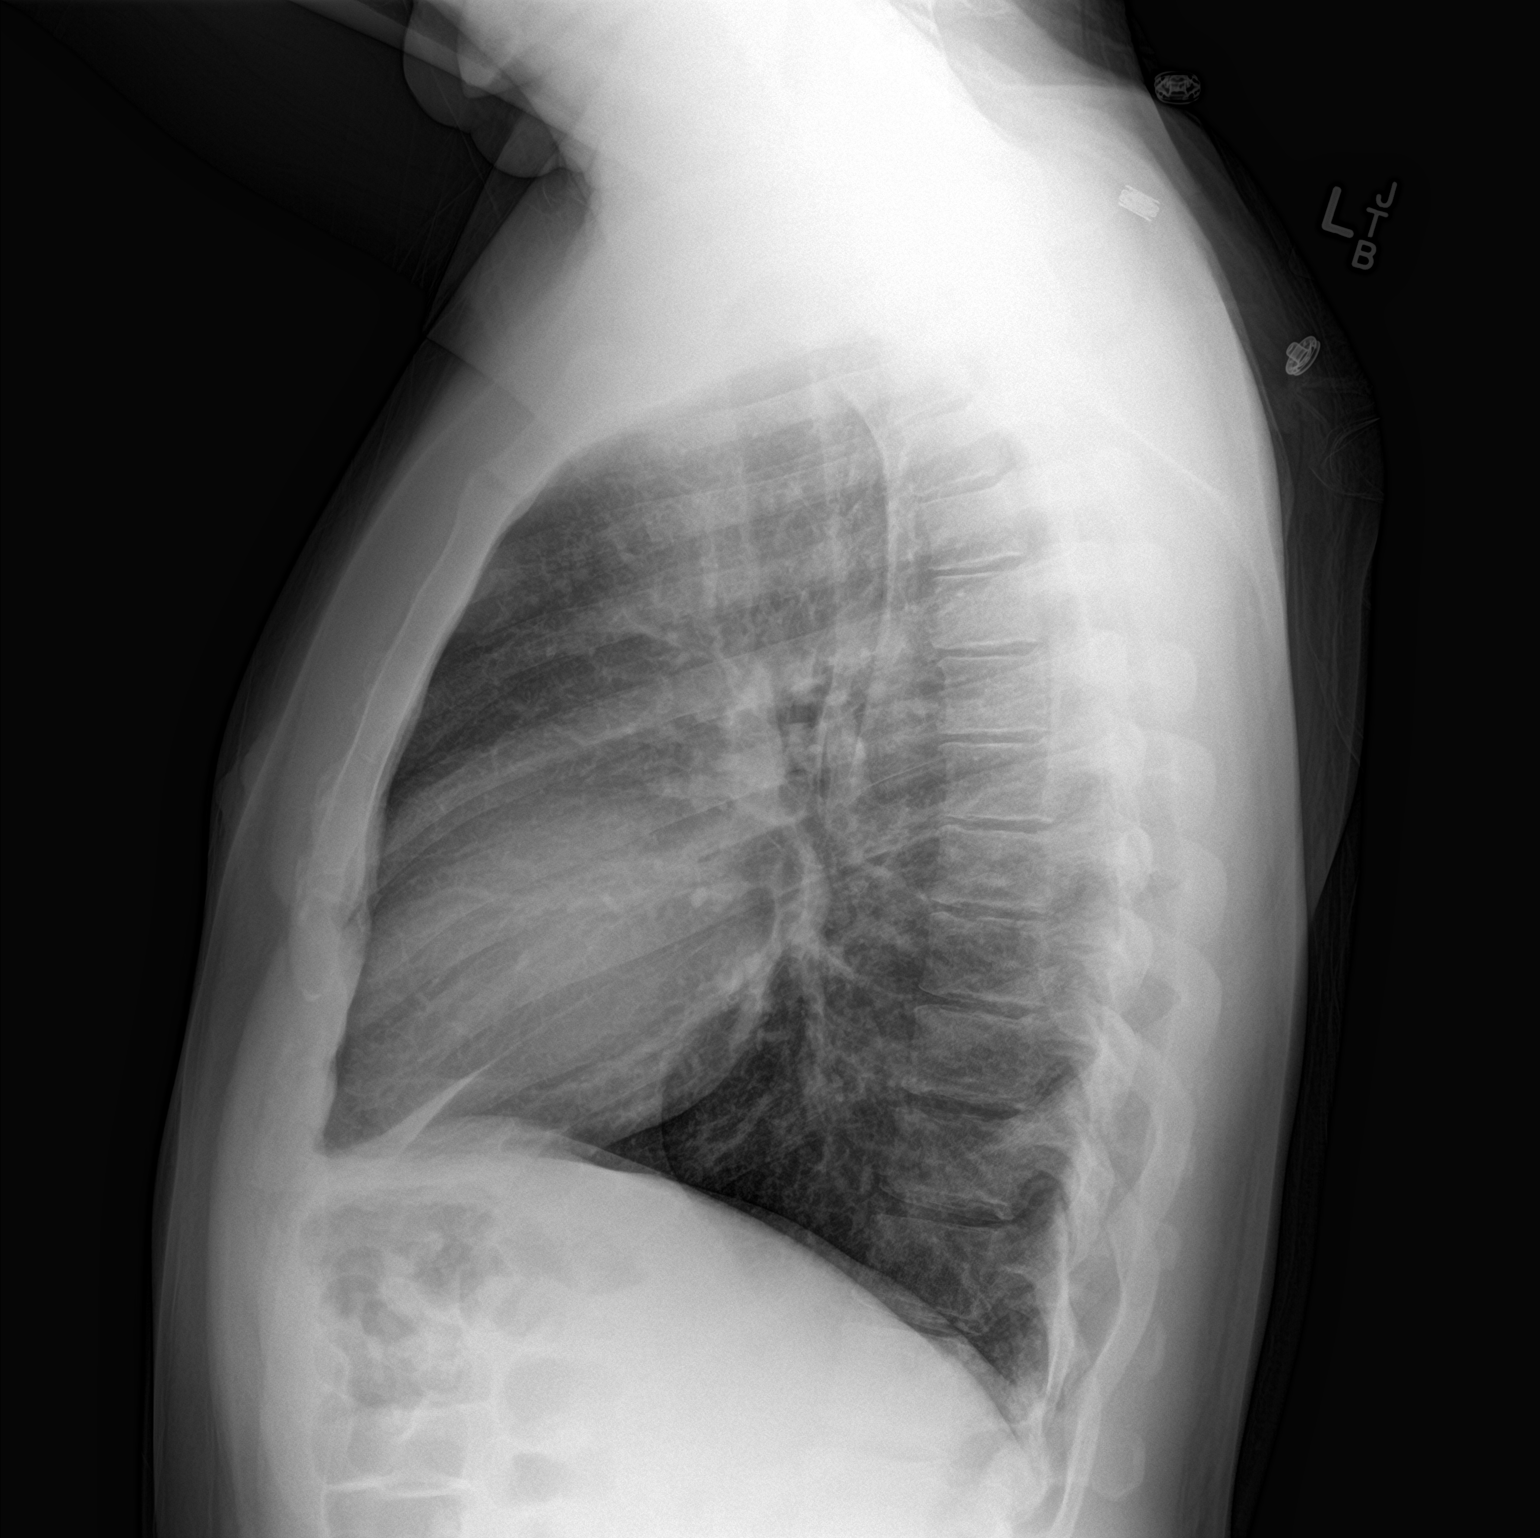

[2 of 2 positions shown; findings below may reference images not displayed]

FINDINGS: Cardiomediastinal silhouette is unremarkable. No acute infiltrate or
pleural effusion. No pulmonary edema. Bony thorax is unremarkable.
IMPRESSION: No active cardiopulmonary disease.

## 2017-09-12 IMAGING — CR DG HAND COMPLETE 3+V*L*
3 series · 3 of 3 positions shown · non-contrast
Comparison: None.

CLINICAL DATA: Nail gun discharge with nail and index finger.

EXAM:
LEFT HAND - COMPLETE 3+ VIEW

[hand pa]
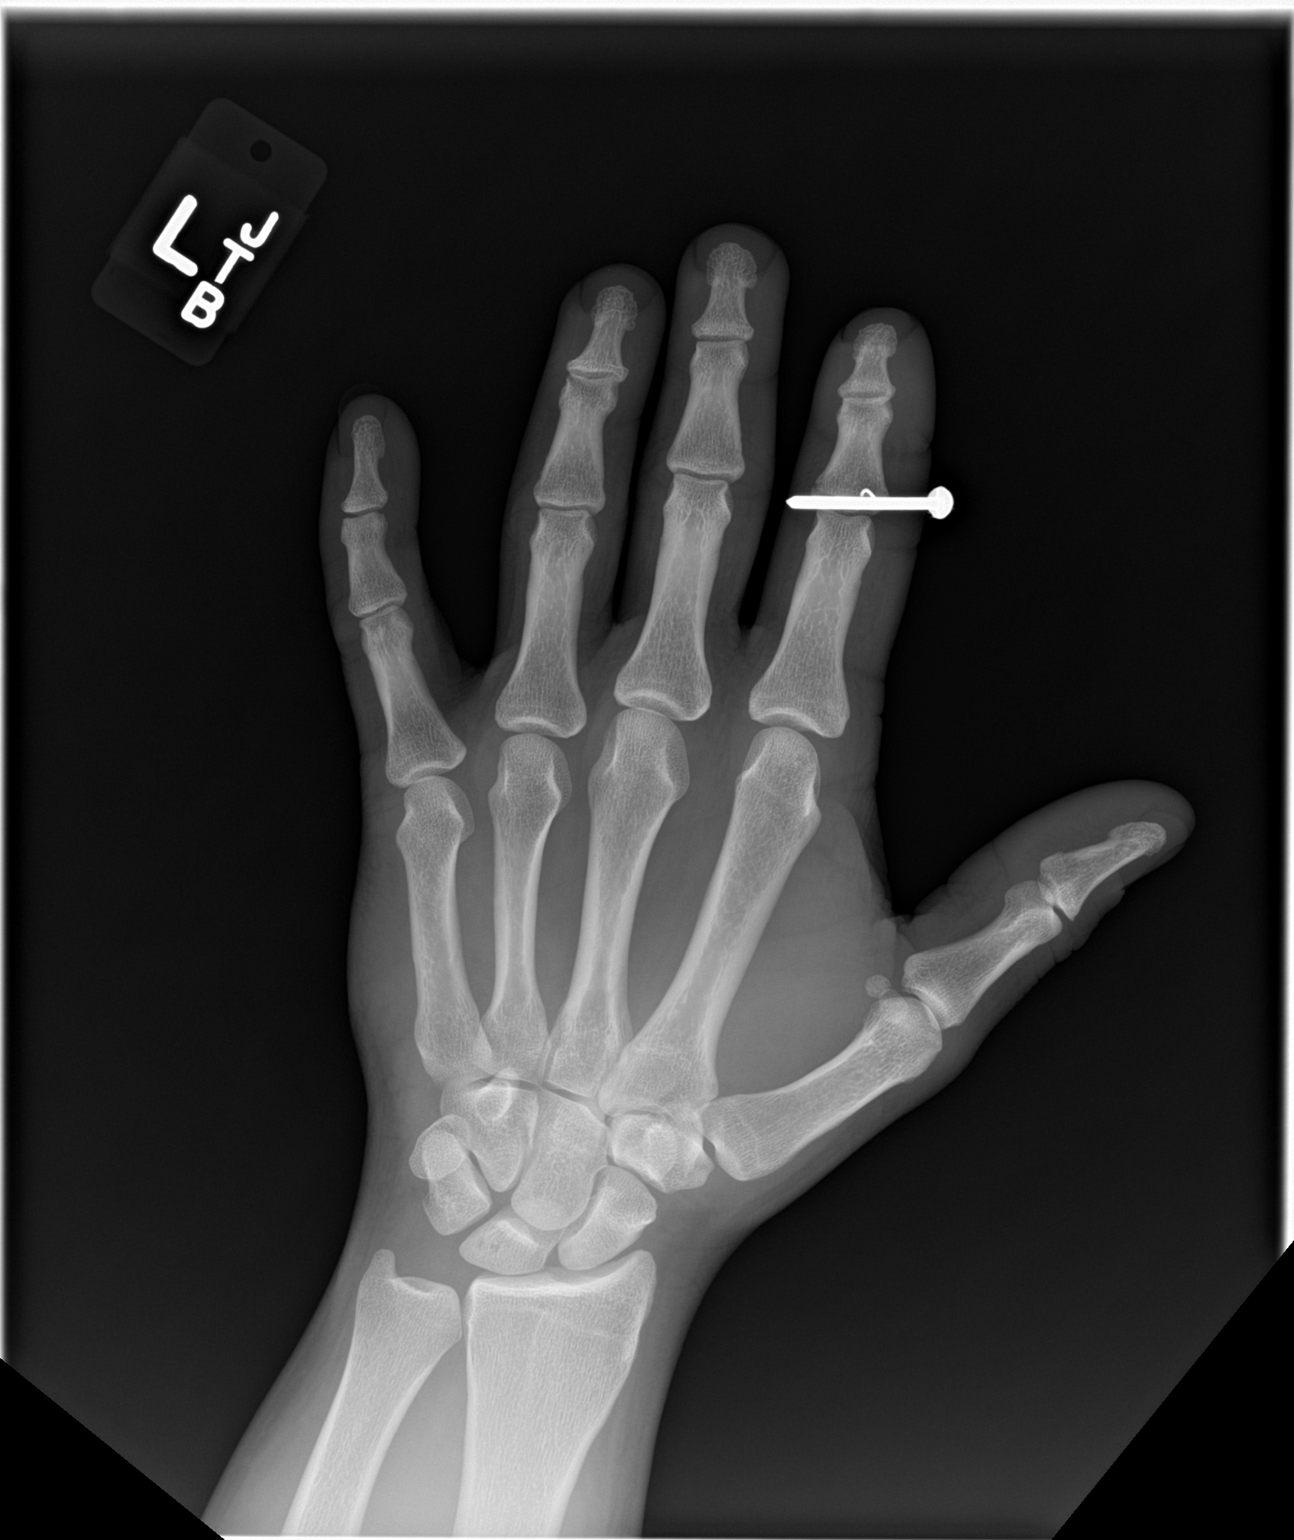

[hand obl]
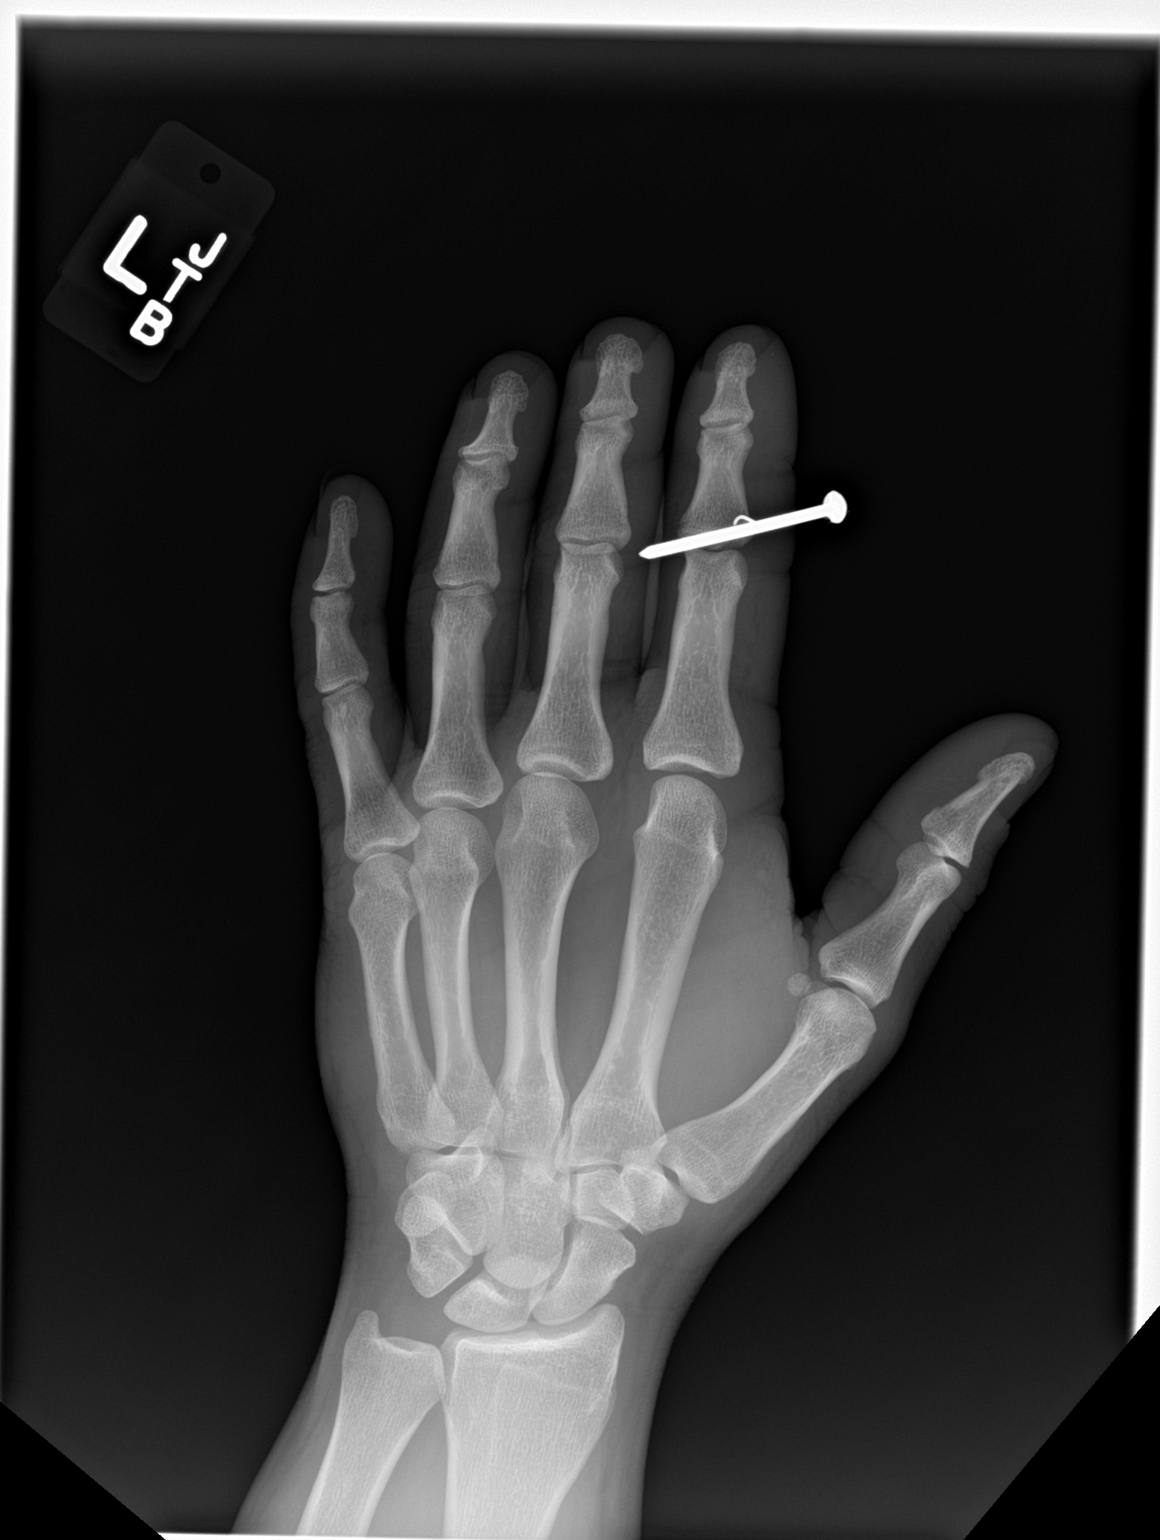

[hand lat]
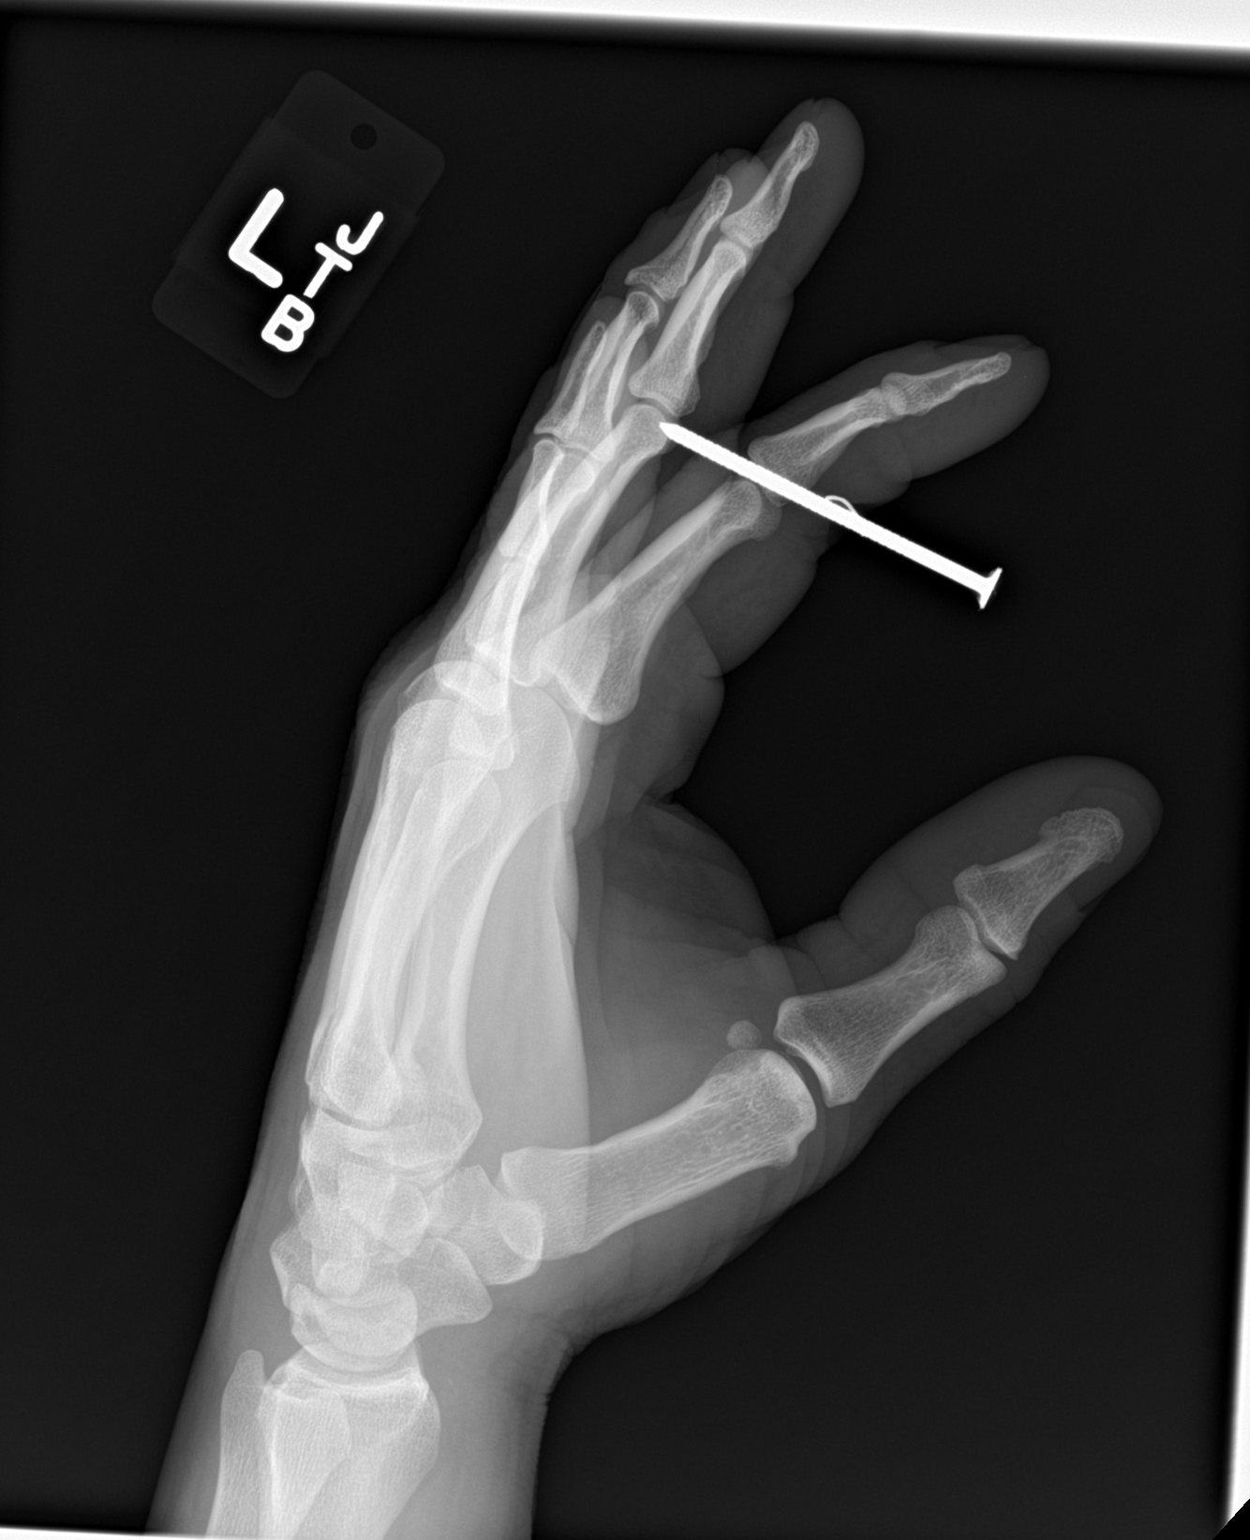

[3 of 3 positions shown; findings below may reference images not displayed]

FINDINGS: Nail passes through the index finger in the region of the proximal
aspect of the middle phalanx in the PIP joint. Nail enters from the
palm are lateral side. Nail height look is just to the level of the
scan. Nail tip protrudes from the dorsal medial portion of the
finger. One can appreciate some degree of fracture of the proximal
aspect of the middle phalanx.
IMPRESSION: Nail passes through the base of the proximal phalanx and the PIP
joint.
